# Patient Record
Sex: Female | Born: 1990 | Race: White | Hispanic: No | Marital: Single | Smoking: Current every day smoker
Health system: Southern US, Community
[De-identification: ages and names within clinical notes are randomized; demographics above are authoritative.]

## PROBLEM LIST (undated history)

## (undated) DIAGNOSIS — N809 Endometriosis, unspecified: Secondary | ICD-10-CM

## (undated) HISTORY — PX: OTHER SURGICAL HISTORY: SHX169

---
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
## 2008-01-23 ENCOUNTER — Emergency Department: Payer: Self-pay | Admitting: Internal Medicine

## 2008-02-16 ENCOUNTER — Emergency Department: Payer: Self-pay | Admitting: Emergency Medicine

## 2008-02-20 ENCOUNTER — Emergency Department: Payer: Self-pay | Admitting: Emergency Medicine

## 2008-04-13 ENCOUNTER — Emergency Department: Payer: Self-pay | Admitting: Emergency Medicine

## 2008-08-30 ENCOUNTER — Emergency Department: Payer: Self-pay | Admitting: Emergency Medicine

## 2009-03-27 ENCOUNTER — Emergency Department: Payer: Self-pay | Admitting: Unknown Physician Specialty

## 2010-06-21 ENCOUNTER — Emergency Department: Payer: Self-pay | Admitting: Unknown Physician Specialty

## 2010-11-11 ENCOUNTER — Emergency Department: Payer: Self-pay | Admitting: Emergency Medicine

## 2011-06-03 ENCOUNTER — Emergency Department: Payer: Self-pay | Admitting: Emergency Medicine

## 2011-06-04 ENCOUNTER — Emergency Department: Payer: Self-pay | Admitting: Emergency Medicine

## 2011-06-12 ENCOUNTER — Ambulatory Visit: Payer: Self-pay | Admitting: Internal Medicine

## 2011-06-14 ENCOUNTER — Emergency Department: Payer: Self-pay | Admitting: Emergency Medicine

## 2011-08-12 IMAGING — US TRANSABDOMINAL ULTRASOUND OF PELVIS
1 series · 17 of 25 positions shown · non-contrast
Comparison: none

REASON FOR EXAM: pain right pelvic
COMMENTS:

PROCEDURE:     US  - US PELVIS EXAM  - June 14, 2011 [DATE]
RESULT:     Comparison: 06/22/2010
TECHNIQUE: Multiple grayscale and color Doppler images were obtained of the
pelvis via transabdominal ultrasound.

[Series 1: transabdominal ultrasound of pelvis · 17 of 33 slices shown]
[im 1/33]
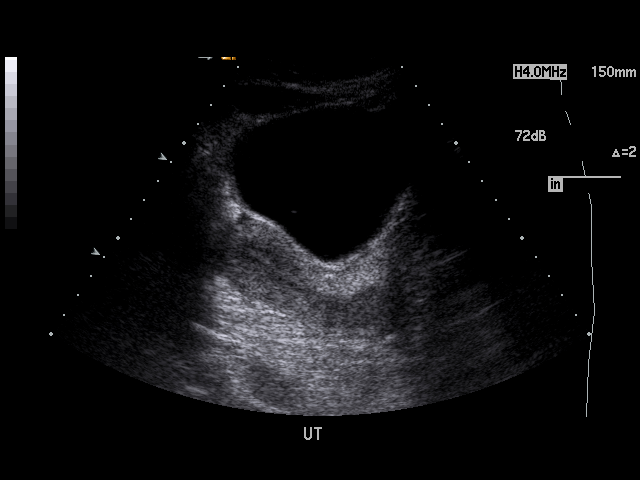
[im 3/33]
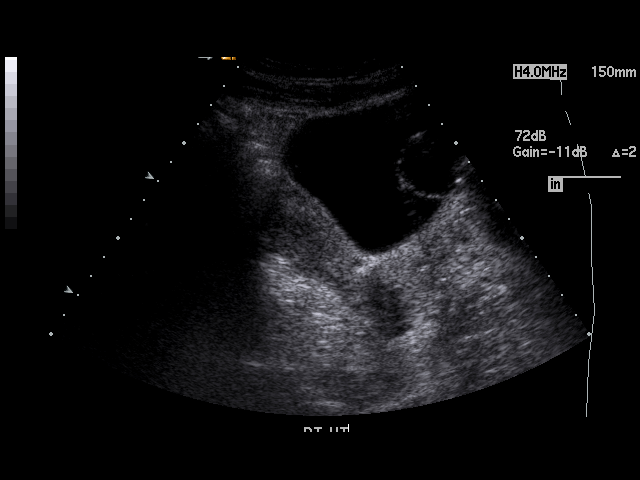
[im 5/33]
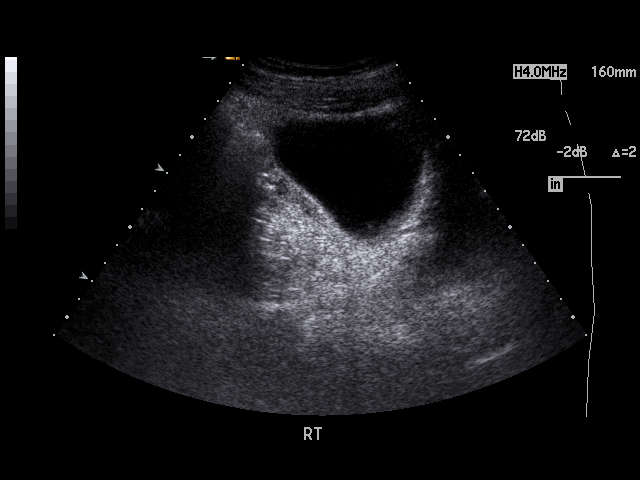
[im 7/33]
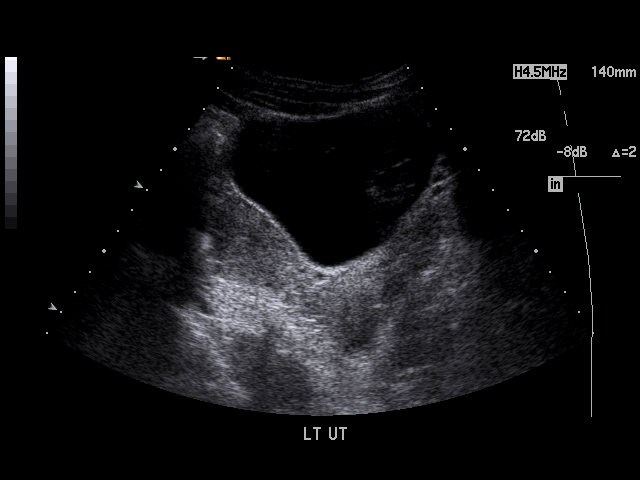
[im 9/33]
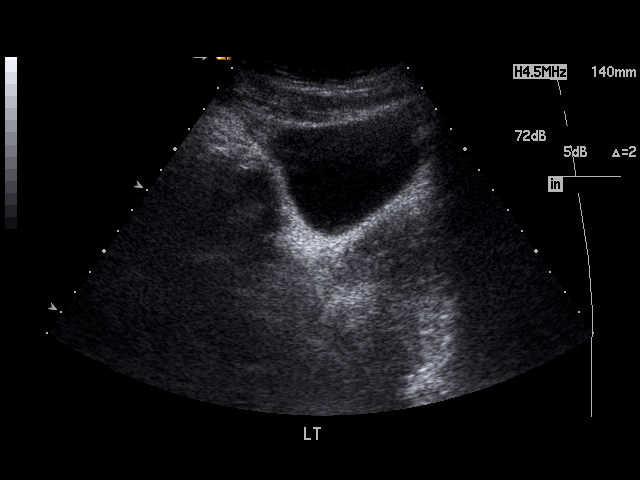
[im 11/33]
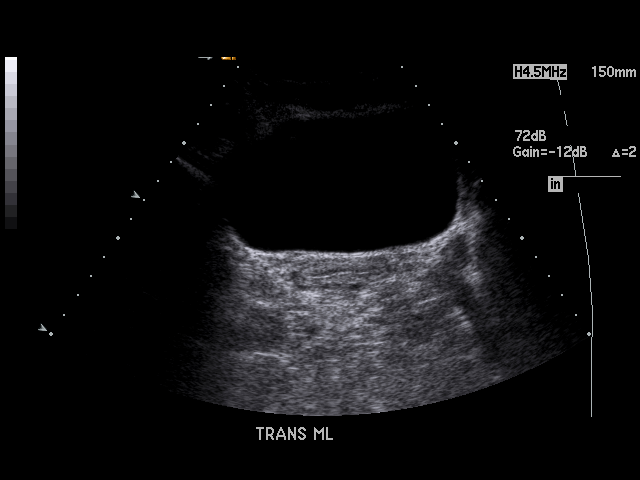
[im 13/33]
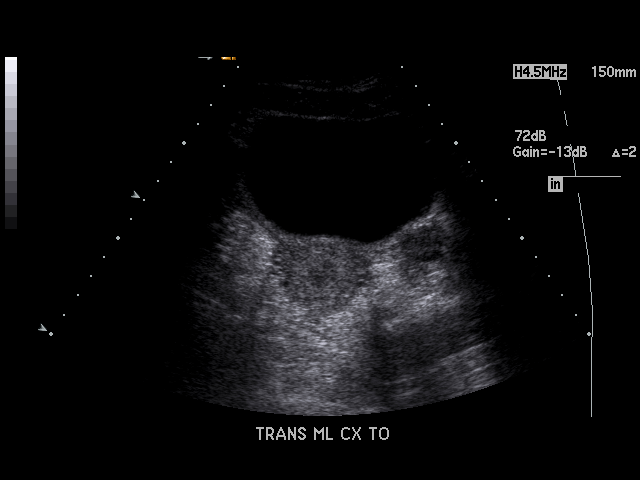
[im 15/33]
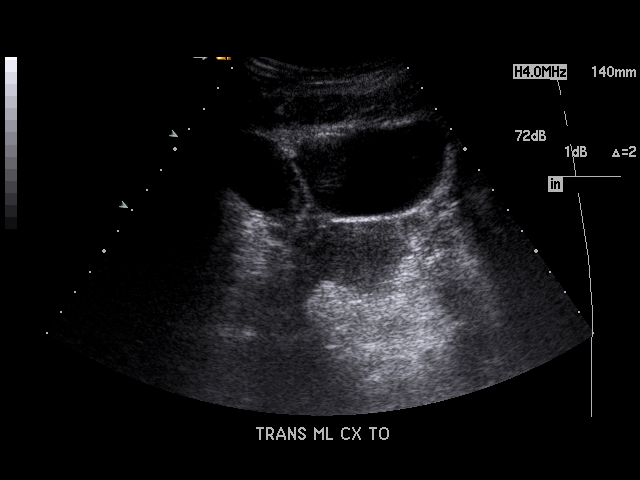
[im 17/33]
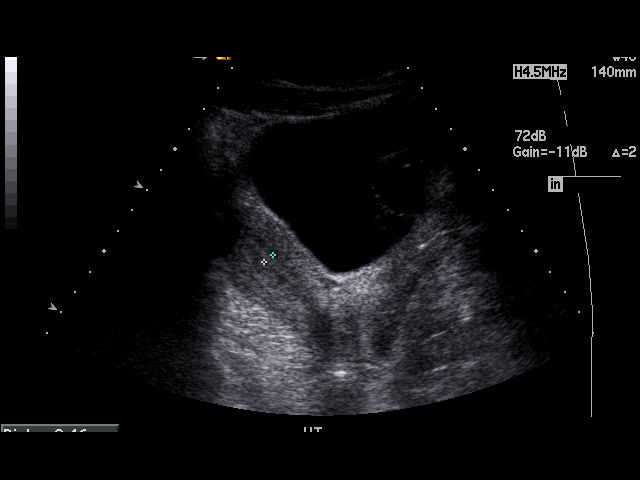
[im 18/33]
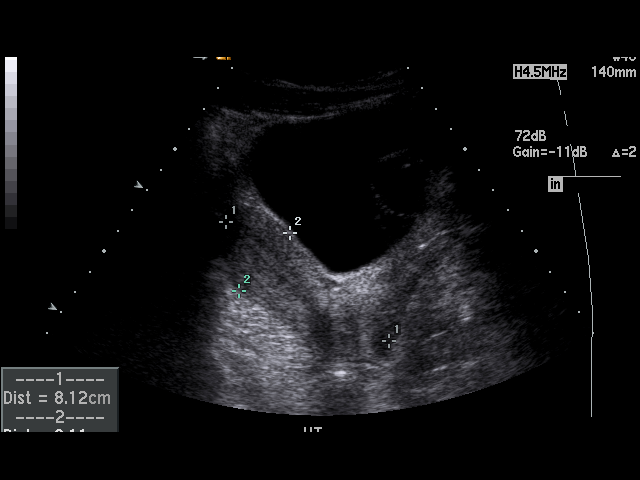
[im 21/33]
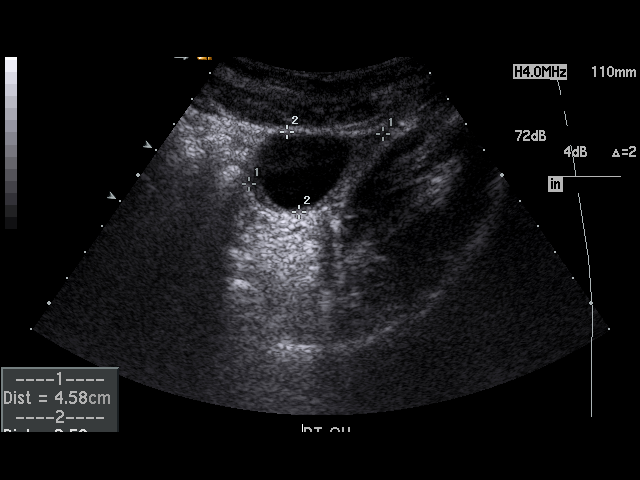
[im 22/33]
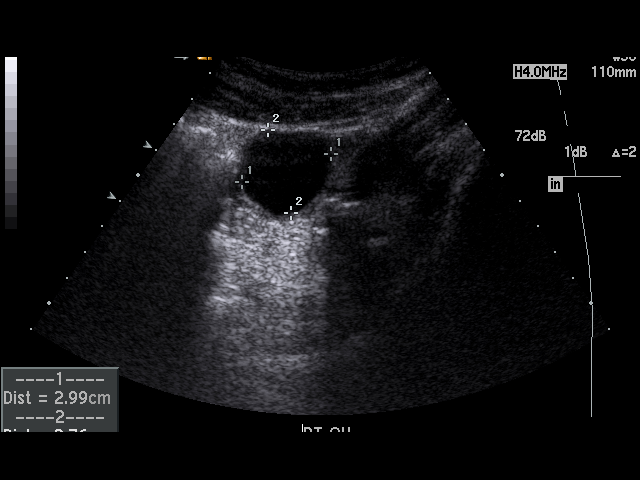
[im 25/33]
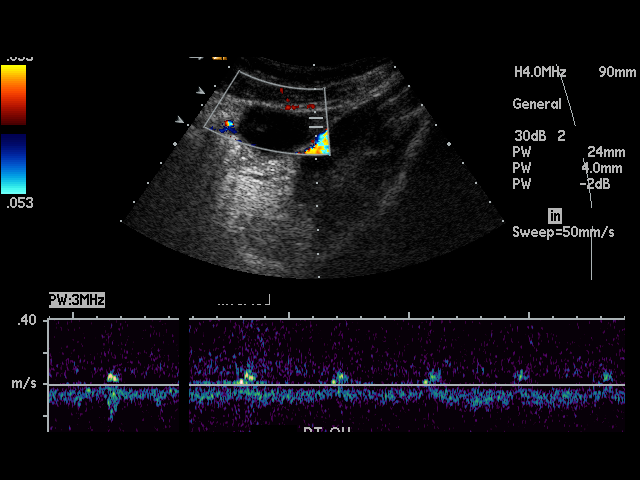
[im 26/33]
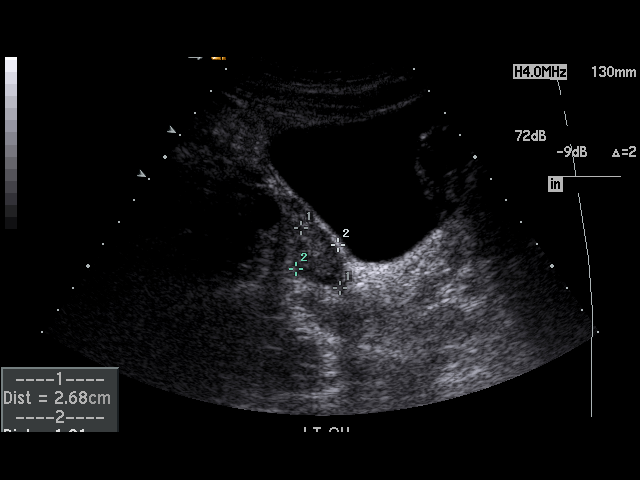
[im 29/33]
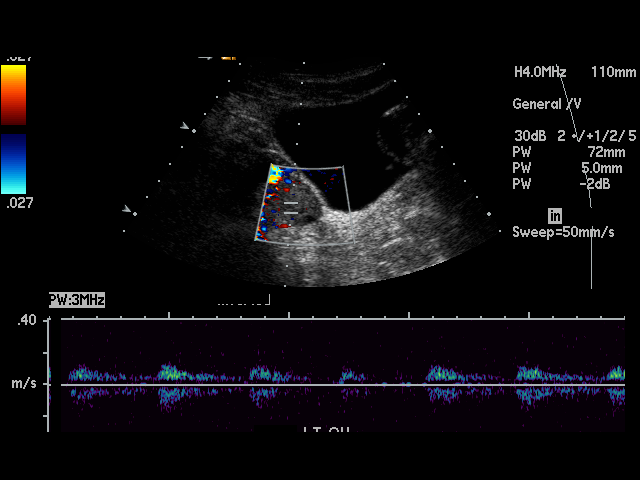
[im 30/33]
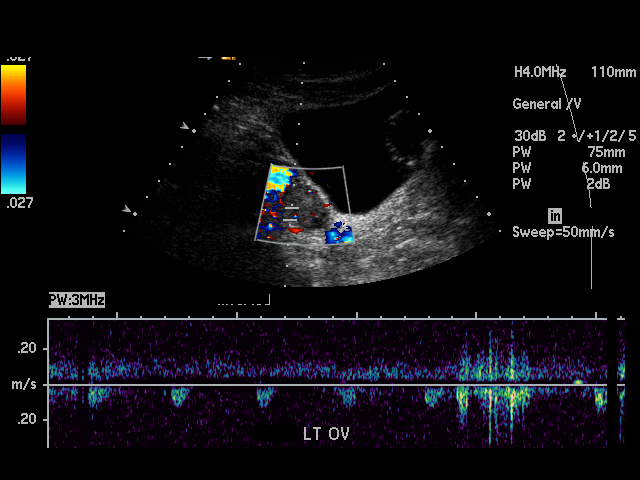
[im 33/33]
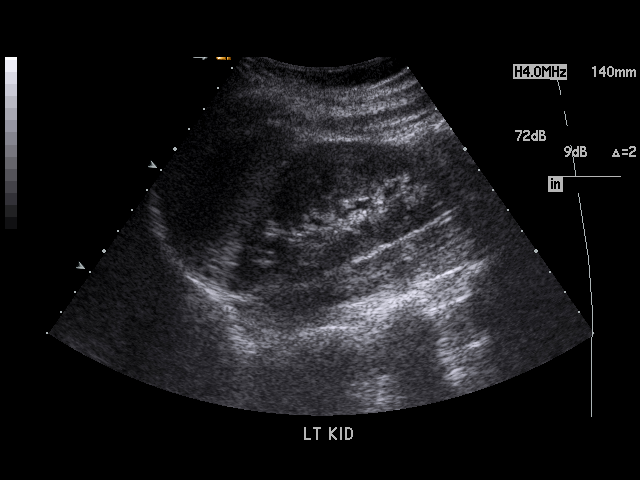

[17 of 25 positions shown; findings below may reference images not displayed]

FINDINGS: The uterus measures 8.1 x 4.2 x 3.1 cm. The endometrial stripe measures 5
mm. There is no free fluid in the pelvis.

There is anechoic cyst in the right ovary which measures 3.0 x 2.5 x 2.8 cm.
Arterial and venous spectral Doppler waveforms are demonstrated in the
bilateral ovaries.
IMPRESSION: There is a 3.0 cyst in the right ovary. Given its size, a followup pelvic
ultrasound in 6 weeks is recommended to ensure resolution .

## 2013-02-04 ENCOUNTER — Encounter (HOSPITAL_COMMUNITY): Payer: Self-pay | Admitting: Emergency Medicine

## 2013-02-04 ENCOUNTER — Emergency Department (HOSPITAL_COMMUNITY)
Admission: EM | Admit: 2013-02-04 | Discharge: 2013-02-04 | Disposition: A | Discharge status: Home / Self Care | Payer: 59 | Source: Home / Self Care | Attending: Emergency Medicine | Admitting: Emergency Medicine

## 2013-02-04 ENCOUNTER — Emergency Department (INDEPENDENT_AMBULATORY_CARE_PROVIDER_SITE_OTHER): Payer: 59

## 2013-02-04 DIAGNOSIS — S60229A Contusion of unspecified hand, initial encounter: Secondary | ICD-10-CM

## 2013-02-04 HISTORY — DX: Endometriosis, unspecified: N80.9

## 2013-02-04 NOTE — ED Notes (Signed)
Pt c/o right hand injury. Pt states that yesterday she was upset with boyfriend and hit the wall with the lateral side of right hand/closed fist. Pt is c/o of pain over joint at ring and pinky finger, some discoloration noted and slight swelling. Pt is able to flex and extend finger some pain and tightness with rotating wrist.   Pt has used ice and ibuprofen for comfort.

## 2013-02-04 NOTE — ED Provider Notes (Signed)
Chief Complaint  Patient presents with  . Hand Injury    right hand injury. incident happened yesterday. pt hit a wall    History of Present Illness:   Cynthia Clayton is a 22 year old female who injured her right hand yesterday around 2 PM. She got mad and struck a wall right hand. Ever since then she's had bruising and swelling and pain over the fifth metacarpal. She's able to fully extend and flex her fingers. She denies any numbness or tingling. She's able to move her wrist well without any difficulty.  Review of Systems:  Other than noted above, the patient denies any of the following symptoms: Systemic:  No fevers, chills, sweats, or aches.  No fatigue or tiredness. Musculoskeletal:  No joint pain, arthritis, bursitis, swelling, back pain, or neck pain. Neurological:  No muscular weakness, paresthesias, headache, or trouble with speech or coordination.  No dizziness.  PMFSH:  Past medical history, family history, social history, meds, and allergies were reviewed.  Physical Exam:   Vital signs:  BP 124/76  Pulse 86  Temp 98.3 F (36.8 C) (Oral)  Resp 16  SpO2 100% Gen:  Alert and oriented times 3.  In no distress. Musculoskeletal: There was some bruising over the fifth metacarpal. No obvious deformity. It's tender to palpation. MCP joint is tender. Wrist is normal has a full range of motion. All her digits have full range of motion.  Otherwise, all joints had a full a ROM with no swelling, bruising or deformity.  No edema, pulses full. Extremities were warm and pink.  Capillary refill was brisk.  Skin:  Clear, warm and dry.  No rash. Neuro:  Alert and oriented times 3.  Muscle strength was normal.  Sensation was intact to light touch.   Radiology:  Dg Hand Complete Right  02/04/2013  *RADIOLOGY REPORT*  Clinical Data: Right hand pain/bruising  RIGHT HAND - COMPLETE 3+ VIEW  Comparison: None.  Findings: No fracture or dislocation is seen.  The joint spaces are preserved.  The  visualized soft tissues are unremarkable.  IMPRESSION: No fracture or dislocation is seen.   Original Report Authenticated By: Charline Bills, M.D.    I reviewed the images independently and personally and concur with the radiologist's findings.  Course in Urgent Care Center:   She was placed in a wrist splint and told to leave this on for about 2 weeks, then remove it and start doing some range of motion exercises.  Assessment:  The encounter diagnosis was Contusion, hand.  Plan:   1.  The following meds were prescribed:   New Prescriptions   No medications on file   2.  The patient was instructed in symptomatic care, including rest and activity, elevation, application of ice and compression.  Appropriate handouts were given. 3.  The patient was told to return if becoming worse in any way, if no better in 3 or 4 days, and given some red flag symptoms that would indicate earlier return.   4.  The patient was told to follow up here if no better in 2 weeks.    Reuben Likes, MD 02/04/13 2008

## 2013-02-08 ENCOUNTER — Emergency Department (HOSPITAL_COMMUNITY)
Admission: EM | Admit: 2013-02-08 | Discharge: 2013-02-08 | Disposition: A | Discharge status: Home / Self Care | Payer: 59 | Source: Home / Self Care

## 2013-02-08 ENCOUNTER — Encounter (HOSPITAL_COMMUNITY): Payer: Self-pay | Admitting: Emergency Medicine

## 2013-02-08 DIAGNOSIS — I889 Nonspecific lymphadenitis, unspecified: Secondary | ICD-10-CM

## 2013-02-08 DIAGNOSIS — K08409 Partial loss of teeth, unspecified cause, unspecified class: Secondary | ICD-10-CM

## 2013-02-08 DIAGNOSIS — K08109 Complete loss of teeth, unspecified cause, unspecified class: Secondary | ICD-10-CM

## 2013-02-08 MED ORDER — CLINDAMYCIN HCL 300 MG PO CAPS: 300.0000 mg | ORAL_CAPSULE | Freq: Three times a day (TID) | ORAL | Status: AC

## 2013-02-08 NOTE — ED Provider Notes (Signed)
History     CSN: [id number]  Arrival date & time 02/08/13  1731   None     Chief Complaint  Patient presents with  . Dental Pain    lower left wisdom tooth pain. facial swelling and lymph node swelling.     (Consider location/radiation/quality/duration/timing/severity/associated sxs/prior treatment) HPI Comments: 22 year old female presents with severe pain just beneath the left mandible. She has had the pain for almost 24 hours. She has a left lower third molar in which there were 2 attempts to extract the unable to extract the deep route. She has a scheduled appointment out of town tomorrow morning at 6:00 to have a root canal. They called the oral surgeon and he recommended that she be seen tonight due to the swelling and very painful node. She is not having fever, toothache, pharyngitis, or buccal oral swelling.   Past Medical History  Diagnosis Date  . Endometriosis     Past Surgical History  Procedure Laterality Date  . Laproscopic endomentriosis      History reviewed. No pertinent family history.  History  Substance Use Topics  . Smoking status: Current Every Day Smoker -- 1.00 packs/day    Types: Cigarettes  . Smokeless tobacco: Not on file  . Alcohol Use: No    OB History   Grav Para Term Preterm Abortions TAB SAB Ect Mult Living                  Review of Systems  Unable to perform ROS Constitutional: Positive for activity change. Negative for fever.  HENT: Positive for neck pain and dental problem. Negative for sore throat and trouble swallowing.        Is a partially open surgical incision over the left third lower molar gingiva. There is no exudate or bleeding. The area is quite tender.  Eyes: Negative.   Respiratory: Negative.   Gastrointestinal: Negative.   Neurological: Negative.   Psychiatric/Behavioral: Negative.   All other systems reviewed and are negative.    Allergies  Amoxicillin; Latex; Penicillins; and Sulfa antibiotics  Home  Medications   Current Outpatient Rx  Name  Route  Sig  Dispense  Refill  . HYDROcodone-acetaminophen (NORCO/VICODIN) 5-325 MG per tablet   Oral   Take 1 tablet by mouth every 6 (six) hours as needed.         Marland Kitchen ibuprofen (ADVIL,MOTRIN) 600 MG tablet   Oral   Take 600 mg by mouth every 6 (six) hours as needed.         . clindamycin (CLEOCIN) 300 MG capsule   Oral   Take 1 capsule (300 mg total) by mouth 3 (three) times daily.   30 capsule   0     BP 102/78  Pulse 86  Temp(Src) 98.4 F (36.9 C) (Oral)  Resp 20  SpO2 100%  Physical Exam  Constitutional: She is oriented to person, place, and time. She appears well-developed and well-nourished. No distress.  HENT:  Right Ear: External ear normal.  Left Ear: External ear normal.  Mouth/Throat: Oropharynx is clear and moist.  Is a partially open surgical incision over the left third lower molar gingiva. There is no exudate or bleeding. The area is quite tender. No obvious abscess.   Eyes: Conjunctivae and EOM are normal.  Neck:  There is a left submandibular node approximately 2 and half centimeters it is extremely tender. She cries withdrawals with attempt to palpation. Am unable to palpate or observed other nodes or nodules in  the area. She denies pain in the teeth.  Cardiovascular: Normal rate.   Pulmonary/Chest: Effort normal.  Lymphadenopathy:    She has cervical adenopathy.  Neurological: She is alert and oriented to person, place, and time. She exhibits normal muscle tone.  Skin: Skin is warm and dry.  Psychiatric: She has a normal mood and affect.    ED Course  Procedures (including critical care time)  Labs Reviewed - No data to display No results found.   1. Submandibular lymphadenitis   2. S/P tooth extraction       MDM  Clindamycin 300 mg percent is used fill the prescription and repeat the dose around 11:00 tonight. Start tomorrow with the 3 times a day dosing. Keep your scheduled appointment in  the morning with the oral surgeon. And continue taking the Advil and Norco for pain. Per any new symptoms problems or worsening overnight may need to go to emergency department.         Hayden Rasmussen, NP 02/08/13 [phone]

## 2013-02-08 NOTE — ED Notes (Signed)
Pt c/o lower left wisdom tooth pain with swelling of lower jaw and lymph nodes. Pt is scheduled in the a.m for surgery/wisdom teeth removal and was told by surgeon to come to be checked before surgery.  Pt has used ice and ibuprofen with no relief.

## 2013-02-08 NOTE — ED Provider Notes (Signed)
Medical screening examination/treatment/procedure(s) were performed by resident physician or non-physician practitioner and as supervising physician I was immediately available for consultation/collaboration.   Barkley Bruns MD.   Linna Hoff, MD 02/08/13 2043

## 2013-02-08 NOTE — ED Notes (Signed)
Pt also states that the area is hot to touch and the swelling is causing pain with swallowing.

## 2013-02-08 NOTE — ED Notes (Signed)
Waiting discharge papers 

## 2013-04-04 IMAGING — CR DG HAND COMPLETE 3+V*R*
3 series · 3 of 3 positions shown · non-contrast
Comparison: None.

CLINICAL DATA: Right hand pain/bruising

RIGHT HAND - COMPLETE 3+ VIEW

[view not recorded (1 of 3)]
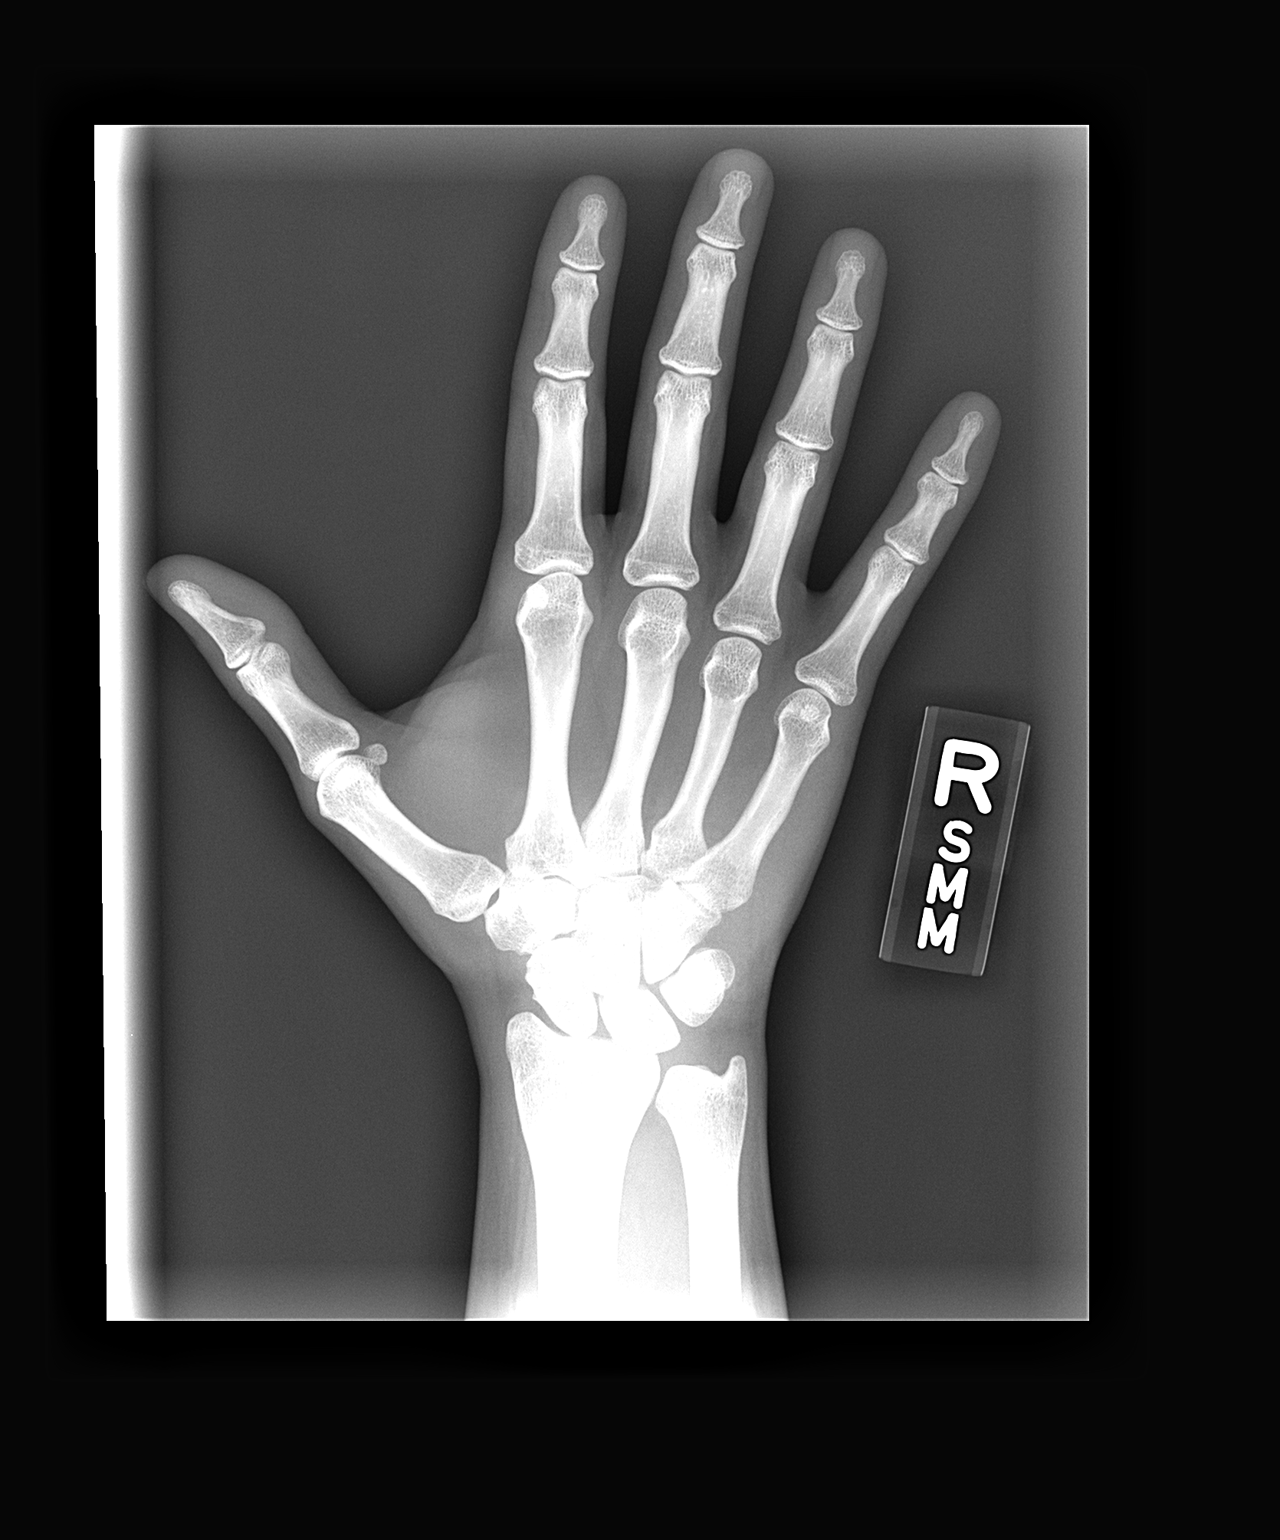

[view not recorded (2 of 3)]
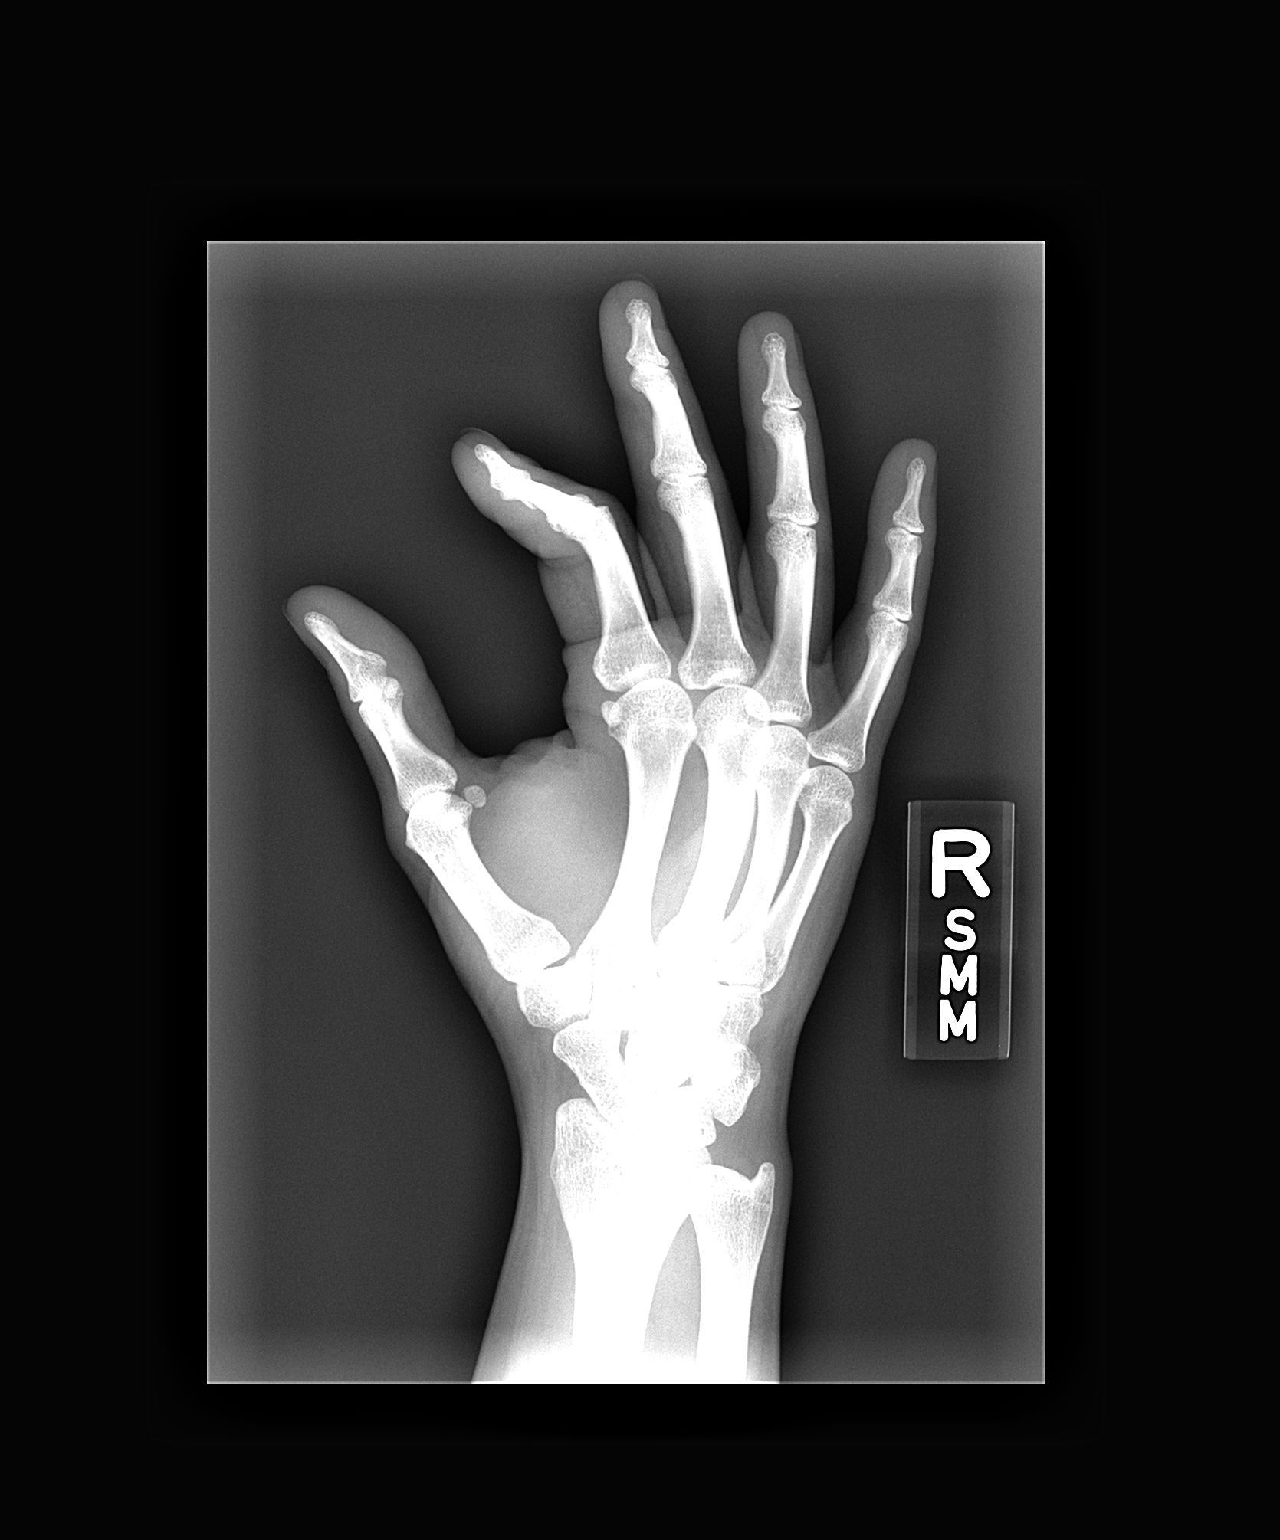

[view not recorded (3 of 3)]
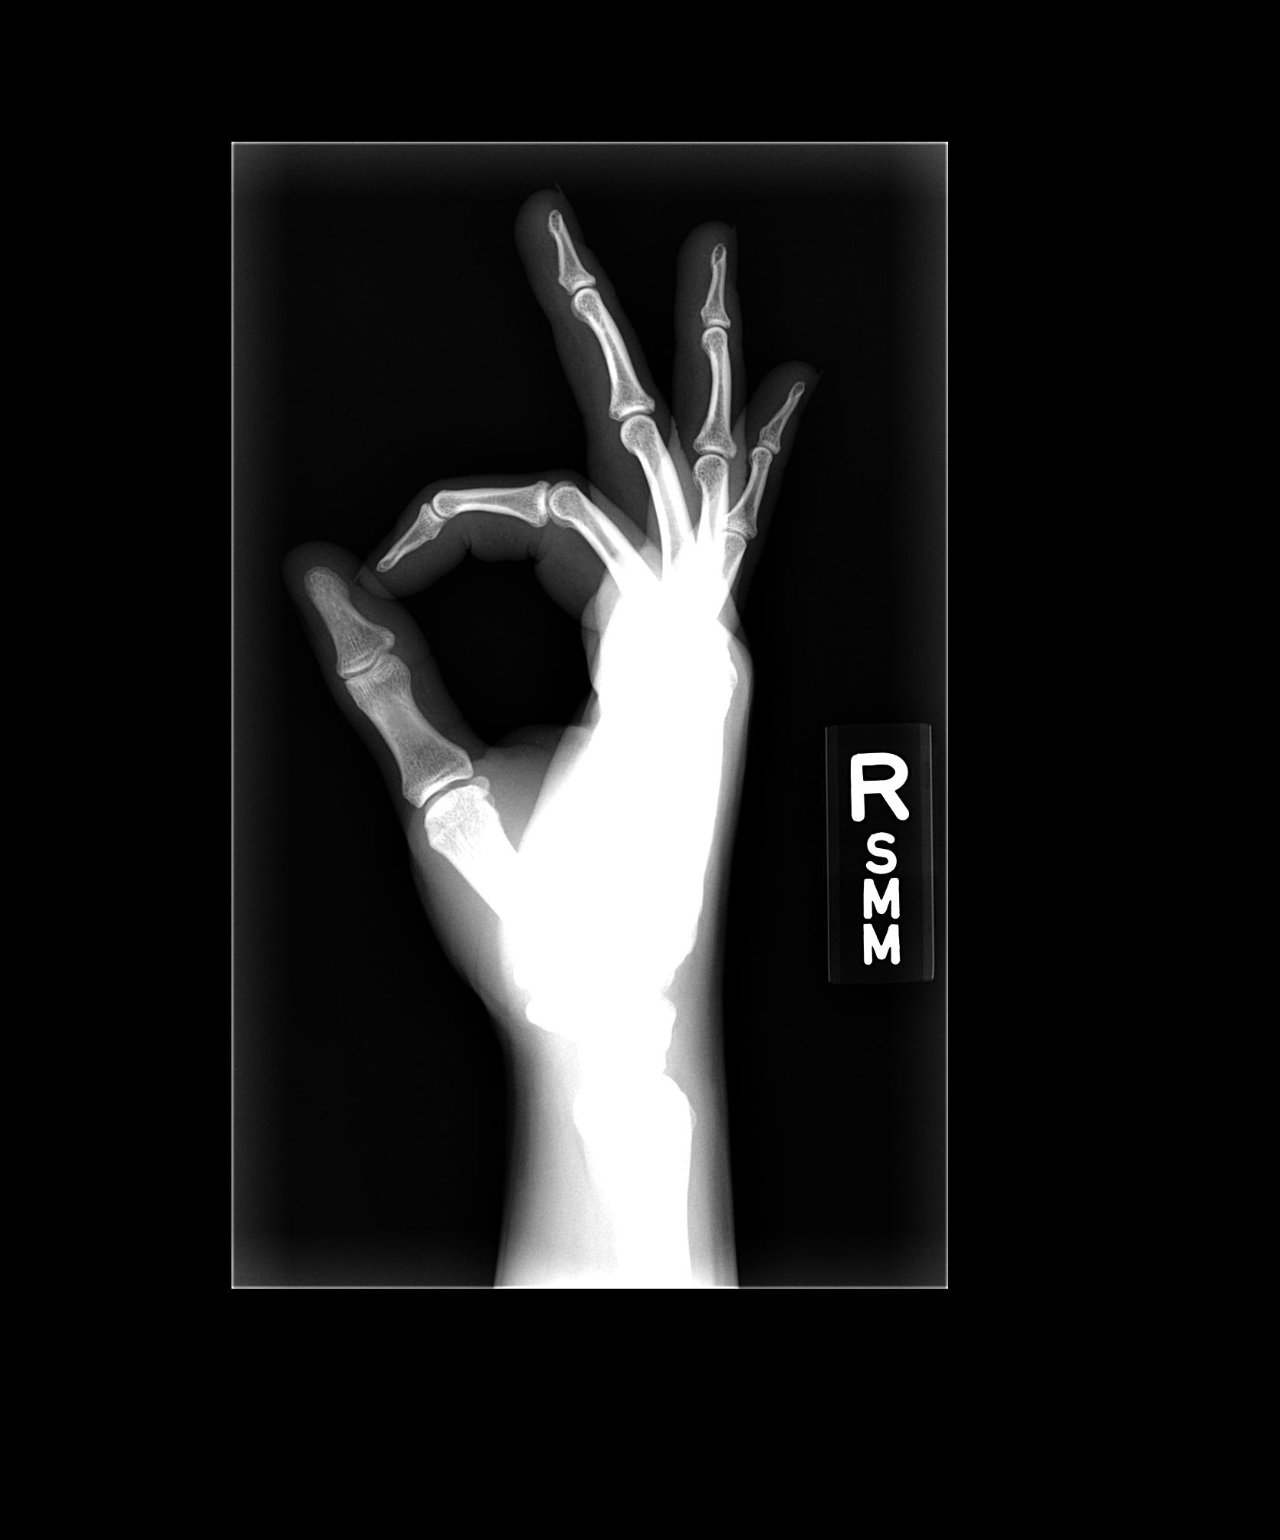

[3 of 3 positions shown; findings below may reference images not displayed]

FINDINGS: No fracture or dislocation is seen.

The joint spaces are preserved.

The visualized soft tissues are unremarkable.
IMPRESSION: No fracture or dislocation is seen.

## 2015-06-28 ENCOUNTER — Telehealth: Payer: Self-pay | Admitting: *Deleted

## 2015-06-28 NOTE — Telephone Encounter (Signed)
Opened in error.  Wrong pt.
